# Patient Record
Sex: Male | Born: 1990 | Race: White | Hispanic: No | Marital: Single | Smoking: Former smoker
Health system: Southern US, Community
[De-identification: ages and names within clinical notes are randomized; demographics above are authoritative.]

---
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
## 2008-01-28 ENCOUNTER — Emergency Department (HOSPITAL_COMMUNITY): Admission: EM | Admit: 2008-01-28 | Discharge: 2008-01-28 | Payer: Self-pay | Admitting: Emergency Medicine

## 2008-02-06 ENCOUNTER — Ambulatory Visit (HOSPITAL_BASED_OUTPATIENT_CLINIC_OR_DEPARTMENT_OTHER): Admission: RE | Admit: 2008-02-06 | Discharge: 2008-02-06 | Payer: Self-pay | Admitting: Otolaryngology

## 2008-10-08 ENCOUNTER — Encounter: Admission: RE | Admit: 2008-10-08 | Discharge: 2008-12-05 | Payer: Self-pay | Admitting: Orthopedic Surgery

## 2008-12-06 ENCOUNTER — Encounter: Admission: RE | Admit: 2008-12-06 | Discharge: 2009-01-21 | Payer: Self-pay | Admitting: Orthopedic Surgery
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
## 2009-03-12 ENCOUNTER — Emergency Department (HOSPITAL_COMMUNITY): Admission: EM | Admit: 2009-03-12 | Discharge: 2009-03-12 | Payer: Self-pay | Admitting: Family Medicine

## 2009-06-14 ENCOUNTER — Emergency Department (HOSPITAL_COMMUNITY): Admission: EM | Admit: 2009-06-14 | Discharge: 2009-06-14 | Payer: Self-pay | Admitting: Family Medicine

## 2009-07-27 ENCOUNTER — Emergency Department (HOSPITAL_COMMUNITY): Admission: EM | Admit: 2009-07-27 | Discharge: 2009-07-27 | Payer: Self-pay | Admitting: Family Medicine
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
## 2013-03-12 ENCOUNTER — Telehealth: Payer: Self-pay | Admitting: Nurse Practitioner

## 2013-03-12 NOTE — Telephone Encounter (Signed)
Please advise 

## 2013-03-12 NOTE — Telephone Encounter (Signed)
Need chart

## 2013-03-13 ENCOUNTER — Telehealth: Payer: Self-pay | Admitting: Nurse Practitioner

## 2013-03-13 NOTE — Telephone Encounter (Signed)
Really have strong concerns about changing him to adderall due to his history. Try melatonin OTC for sleep and see if helps.

## 2013-03-14 NOTE — Telephone Encounter (Signed)
Pt's mother aware.

## 2013-03-14 NOTE — Telephone Encounter (Signed)
Please see previous telephone encounter for additional information

## 2013-05-04 ENCOUNTER — Other Ambulatory Visit: Payer: Self-pay | Admitting: Nurse Practitioner

## 2013-05-04 MED ORDER — QUETIAPINE FUMARATE 50 MG PO TABS: 50.0000 mg | ORAL_TABLET | Freq: Every day | ORAL | Status: DC
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
## 2013-06-22 ENCOUNTER — Other Ambulatory Visit: Payer: Self-pay | Admitting: Nurse Practitioner

## 2013-06-22 MED ORDER — QUETIAPINE FUMARATE 50 MG PO TABS: 50.0000 mg | ORAL_TABLET | Freq: Two times a day (BID) | ORAL | Status: DC

## 2013-08-31 ENCOUNTER — Other Ambulatory Visit: Payer: Self-pay | Admitting: Nurse Practitioner

## 2013-08-31 MED ORDER — QUETIAPINE FUMARATE 100 MG PO TABS: 100.0000 mg | ORAL_TABLET | Freq: Every day | ORAL | Status: DC

## 2014-06-14 ENCOUNTER — Other Ambulatory Visit: Payer: Self-pay | Admitting: Nurse Practitioner

## 2014-06-14 MED ORDER — ERGOCALCIFEROL 1.25 MG (50000 UT) PO CAPS: 50000.0000 [IU] | ORAL_CAPSULE | ORAL | Status: DC

## 2014-10-24 ENCOUNTER — Telehealth: Payer: Self-pay | Admitting: *Deleted

## 2014-10-24 ENCOUNTER — Encounter: Payer: Self-pay | Admitting: Nurse Practitioner

## 2014-10-24 NOTE — Telephone Encounter (Signed)
Mother called stating the she needs a letter stating that she needs a letter for Kenneth Harmon. Mother states that she has spoke with you about this.

## 2014-10-24 NOTE — Telephone Encounter (Signed)
Let mom know letter is ready for pick up.

## 2014-10-24 NOTE — Telephone Encounter (Signed)
Patient mother that letter ready to be picked up

## 2014-11-19 ENCOUNTER — Encounter: Payer: Self-pay | Admitting: Nurse Practitioner

## 2014-11-21 ENCOUNTER — Encounter: Payer: Self-pay | Admitting: Nurse Practitioner

## 2021-12-17 ENCOUNTER — Ambulatory Visit: Payer: 59 | Admitting: Nurse Practitioner

## 2021-12-17 ENCOUNTER — Encounter: Payer: Self-pay | Admitting: Nurse Practitioner

## 2021-12-17 ENCOUNTER — Other Ambulatory Visit: Payer: Self-pay

## 2021-12-17 ENCOUNTER — Ambulatory Visit (INDEPENDENT_AMBULATORY_CARE_PROVIDER_SITE_OTHER): Payer: 59

## 2021-12-17 VITALS — BP 134/85 | HR 100 | Temp 98.6°F | Resp 20 | Ht 70.0 in | Wt 208.0 lb

## 2021-12-17 DIAGNOSIS — J40 Bronchitis, not specified as acute or chronic: Secondary | ICD-10-CM | POA: Diagnosis not present

## 2021-12-17 DIAGNOSIS — R051 Acute cough: Secondary | ICD-10-CM | POA: Diagnosis not present

## 2021-12-17 MED ORDER — PREDNISONE 20 MG PO TABS: 40.0000 mg | ORAL_TABLET | Freq: Every day | ORAL | 0 refills | Status: AC

## 2021-12-17 NOTE — Progress Notes (Signed)
Subjective:    Patient ID: Kenneth Harmon, male    DOB: 04/10/1991, 31 y.o.   MRN: [id number]   Chief Complaint: cough  Patient has been sick for several days . He went to the urgent cre on 12/15/21. He had strep , flu and covid test and they were all negative. He was given decadron injection and prescription for zpak and promathazine- dextromethorphan. He is no better. Has lot so congestion in his chest that feels like it will not break up. Chest hurts when he coughs.      Review of Systems  Constitutional:  Positive for fatigue. Negative for chills and fever.  HENT:  Positive for congestion and rhinorrhea. Negative for sinus pressure and sore throat.   Respiratory:  Positive for cough. Negative for shortness of breath.   Musculoskeletal:  Negative for myalgias.  Neurological:  Positive for headaches.      Objective:   Physical Exam Vitals reviewed.  Constitutional:      Appearance: Normal appearance.  HENT:     Right Ear: Tympanic membrane normal.     Left Ear: Tympanic membrane normal.     Nose: Nose normal.     Mouth/Throat:     Mouth: Mucous membranes are moist.  Cardiovascular:     Rate and Rhythm: Normal rate and regular rhythm.  Pulmonary:     Effort: Pulmonary effort is normal.     Breath sounds: Normal breath sounds.  Musculoskeletal:     Cervical back: Normal range of motion.  Skin:    General: Skin is warm.  Neurological:     General: No focal deficit present.     Mental Status: He is alert.  Psychiatric:        Mood and Affect: Mood normal.        Behavior: Behavior normal.      BP 134/85    Pulse 100    Temp 98.6 F (37 C) (Temporal)    Resp 20    Ht 5\' 10"  (1.778 m)    Wt 208 lb (94.3 kg)    SpO2 96%    BMI 29.84 kg/m  Chest xray- clear-Preliminary reading by , FNP  [facility]     Assessment & Plan:  [facility] in today with chief complaint of Cough, Nasal Congestion, and Diarrhea   1. Acute cough - DG Chest 2 View  2.  Bronchitis 1. Take meds as prescribed 2. Use a cool mist humidifier especially during the winter months and when heat has been humid. 3. Use saline nose sprays frequently 4. Saline irrigations of the nose can be very helpful if done frequently.  * 4X daily for 1 week*  * Use of a nettie pot can be helpful with this. Follow directions with this* 5. Drink plenty of fluids 6. Keep thermostat turn down low 7.For any cough or congestion- mucinex OTC 8. For fever or aces or pains- take tylenol or ibuprofen appropriate for age and weight.  * for fevers greater than 101 orally you may alternate ibuprofen and tylenol every  3 hours.   Finish z pak as directed Meds ordered this encounter  Medications   predniSONE (DELTASONE) 20 MG tablet    Sig: Take 2 tablets (40 mg total) by mouth daily with breakfast for 5 days. 2 po daily for 5 days    Dispense:  10 tablet    Refill:  0    Order Specific Question:   Supervising Provider  Answer:   Nils Pyle [[id number]]    Urgent care records reviewed   The above assessment and management plan was discussed with the patient. The patient verbalized understanding of and has agreed to the management plan. Patient is aware to call the clinic if symptoms persist or worsen. Patient is aware when to return to the clinic for a follow-up visit. Patient educated on when it is appropriate to go to the emergency department.   Mary-Margaret Daphine Deutscher, FNP

## 2021-12-17 NOTE — Patient Instructions (Signed)

## 2023-01-17 ENCOUNTER — Ambulatory Visit: Payer: 59 | Admitting: Family Medicine

## 2023-01-17 ENCOUNTER — Encounter: Payer: Self-pay | Admitting: Family Medicine

## 2023-01-17 VITALS — BP 132/78 | HR 100 | Temp 98.2°F | Ht 70.0 in | Wt 214.0 lb

## 2023-01-17 DIAGNOSIS — J069 Acute upper respiratory infection, unspecified: Secondary | ICD-10-CM

## 2023-01-17 LAB — VERITOR FLU A/B WAIVED
Influenza A: NEGATIVE
Influenza B: NEGATIVE

## 2023-01-17 MED ORDER — BENZONATATE 100 MG PO CAPS: 100.0000 mg | ORAL_CAPSULE | Freq: Three times a day (TID) | ORAL | 0 refills | Status: DC | PRN

## 2023-01-17 MED ORDER — PROMETHAZINE-DM 6.25-15 MG/5ML PO SYRP: 2.5000 mL | ORAL_SOLUTION | Freq: Four times a day (QID) | ORAL | 0 refills | Status: DC | PRN

## 2023-01-17 NOTE — Patient Instructions (Signed)
It appears that you have a viral upper respiratory infection (cold).  Cold symptoms can last up to 2 weeks.    We are testing you for COVID.  This test should return in a couple of days.  In the meantime, a cough syrup and perle have been sent.  IF you start coughing up BROWN or BLOOD, call me immediately as this indicates you have developed a secondary BACTERIAL infection that requires an antibiotic.  - Get plenty of rest and drink plenty of fluids. - Try to breathe moist air. Use a cold mist humidifier. - Consume warm fluids (soup or tea) to provide relief for a stuffy nose and to loosen phlegm. - For nasal stuffiness, try saline nasal spray or a Neti Pot. Afrin nasal spray can also be used but this product should not be used longer than 3 days or it will cause rebound nasal stuffiness (worsening nasal congestion). - For sore throat pain relief: use chloraseptic spray, suck on throat lozenges, hard candy or popsicles; gargle with warm salt water (1/4 tsp. salt per 8 oz. of water); and eat soft, bland foods. - Eat a well-balanced diet. If you cannot, ensure you are getting enough nutrients by taking a daily multivitamin. - Avoid dairy products, as they can thicken phlegm. - Avoid alcohol, as it impairs your body's immune system.  CONTACT YOUR DOCTOR IF YOU EXPERIENCE ANY OF THE FOLLOWING: - High fever - Ear pain - Sinus-type headache - Unusually severe cold symptoms - Cough that gets worse while other cold symptoms improve - Flare up of any chronic lung problem, such as asthma - Your symptoms persist longer than 2 weeks

## 2023-01-17 NOTE — Progress Notes (Signed)
Subjective: CC:URI with cough PCP: Chevis Pretty, FNP Kenneth Harmon is a 31 y.o. male presenting to clinic today for:  1. Chest congestion Patient reports a 2 to 3-day history of cough, chest congestion.  He reports sometimes the cough is so severe that he has to bend over and almost feels like he can throw up.  He reports sinus pressure.  No measured fevers but having some chills, arthralgia and myalgia.  Today he developed nonbloody diarrhea.  No associated nausea, vomiting.  Sputum is yellow in color.  He is a former smoker.  Thinks maybe he has been having some wheezing.  Has had multiple sick contacts but not sure what they are sick with.  Currently utilizing Tylenol and some over-the-counter sinus medications with little improvement in symptoms and in fact seems to be worsening today.   ROS: see HPI  Allergies  Allergen Reactions   Amoxicillin    History reviewed. No pertinent past medical history. No current outpatient medications on file. Social History   Socioeconomic History   Marital status: Single    Spouse name: Not on file   Number of children: Not on file   Years of education: Not on file   Highest education level: Not on file  Occupational History   Not on file  Tobacco Use   Smoking status: Former    Types: Cigarettes   Smokeless tobacco: Never  Substance and Sexual Activity   Alcohol use: Not on file   Drug use: Not on file   Sexual activity: Not on file  Other Topics Concern   Not on file  Social History Narrative   Not on file   Social Determinants of Health   Financial Resource Strain: Not on file  Food Insecurity: Not on file  Transportation Needs: Not on file  Physical Activity: Not on file  Stress: Not on file  Social Connections: Not on file  Intimate Partner Violence: Not on file   History reviewed. No pertinent family history.  Objective: Office vital signs reviewed. BP 132/78   Pulse 100   Temp 98.2 F (36.8 C)    Ht 5' 10"$  (1.778 m)   Wt 214 lb (97.1 kg)   SpO2 99%   BMI 30.71 kg/m   Physical Examination:  General: Awake, alert, nontoxic male, No acute distress HEENT: Normal    Neck: No masses palpated. No lymphadenopathy    Ears: Tympanic membranes intact, normal light reflex, no erythema, no bulging    Eyes: PERRLA, extraocular membranes intact, sclera white    Nose: nasal turbinates moist, clear nasal discharge    Throat: moist mucus membranes, minimal oropharyngeal erythema, no tonsillar exudate.  Airway is patent Cardio: Mildly tachycardic with regular rhythm.  S1S2 heard, no murmurs appreciated Pulm: clear to auscultation bilaterally, no wheezes, rhonchi or rales; normal work of breathing on room air  Assessment/ Plan: 32 y.o. male   URI with cough and congestion - Plan: COVID-19, Flu A+B and RSV, Veritor Flu A/B Waived, promethazine-dextromethorphan (PROMETHAZINE-DM) 6.25-15 MG/5ML syrup, benzonatate (TESSALON PERLES) 100 MG capsule  Appears to be viral URI.  Nothing to suggest secondary bacterial infection at this time but we discussed signs and symptoms that would suggest bacterial infection and warrant antibiotic usage.  Rapid flu was negative.  Cough medications have been sent to pharmacy.  Work note provided.  Follow-up as needed  Orders Placed This Encounter  Procedures   COVID-19, Flu A+B and RSV    Order Specific Question:  Previously tested for COVID-19    Answer:   No    Order Specific Question:   Resident in a congregate (group) care setting    Answer:   No    Order Specific Question:   Is the patient student?    Answer:   No    Order Specific Question:   Employed in healthcare setting    Answer:   No    Order Specific Question:   Has patient completed COVID vaccination(s) (2 doses of Pfizer/Moderna 1 dose of Sharon)    Answer:   No   Veritor Flu A/B Waived    Order Specific Question:   Source    Answer:   swab   No orders of the defined types were placed  in this encounter.    Janora Norlander, DO [place] [phone]

## 2023-01-19 ENCOUNTER — Other Ambulatory Visit: Payer: Self-pay | Admitting: Family Medicine

## 2023-01-19 LAB — COVID-19, FLU A+B AND RSV
Influenza A, NAA: NOT DETECTED
Influenza B, NAA: DETECTED — AB
RSV, NAA: NOT DETECTED
SARS-CoV-2, NAA: NOT DETECTED

## 2023-01-19 MED ORDER — AZITHROMYCIN 250 MG PO TABS: ORAL_TABLET | ORAL | 0 refills | Status: DC

## 2024-01-05 ENCOUNTER — Telehealth: Payer: 59 | Admitting: Family

## 2024-01-05 ENCOUNTER — Encounter: Payer: Self-pay | Admitting: Family

## 2024-01-05 DIAGNOSIS — R112 Nausea with vomiting, unspecified: Secondary | ICD-10-CM

## 2024-01-05 DIAGNOSIS — R6889 Other general symptoms and signs: Secondary | ICD-10-CM

## 2024-01-05 DIAGNOSIS — R059 Cough, unspecified: Secondary | ICD-10-CM

## 2024-01-05 DIAGNOSIS — R519 Headache, unspecified: Secondary | ICD-10-CM

## 2024-01-05 MED ORDER — ONDANSETRON HCL 4 MG PO TABS: 4.0000 mg | ORAL_TABLET | Freq: Three times a day (TID) | ORAL | 0 refills | Status: DC | PRN

## 2024-01-05 MED ORDER — OSELTAMIVIR PHOSPHATE 75 MG PO CAPS: 75.0000 mg | ORAL_CAPSULE | Freq: Two times a day (BID) | ORAL | 0 refills | Status: DC

## 2024-01-05 NOTE — Progress Notes (Signed)
Virtual Visit Consent   Kenneth Harmon, you are scheduled for a virtual visit with a [facility] provider today. Just as with appointments in the office, your consent must be obtained to participate. Your consent will be active for this visit and any virtual visit you may have with one of our providers in the next 365 days. If you have a MyChart account, a copy of this consent can be sent to you electronically.  As this is a virtual visit, video technology does not allow for your provider to perform a traditional examination. This may limit your provider's ability to fully assess your condition. If your provider identifies any concerns that need to be evaluated in person or the need to arrange testing (such as labs, EKG, etc.), we will make arrangements to do so. Although advances in technology are sophisticated, we cannot ensure that it will always work on either your end or our end. If the connection with a video visit is poor, the visit may have to be switched to a telephone visit. With either a video or telephone visit, we are not always able to ensure that we have a secure connection.  By engaging in this virtual visit, you consent to the provision of healthcare and authorize for your insurance to be billed (if applicable) for the services provided during this visit. Depending on your insurance coverage, you may receive a charge related to this service.  I need to obtain your verbal consent now. Are you willing to proceed with your visit today? Kenneth Harmon has provided verbal consent on 01/05/2024 for a virtual visit (video or telephone). Kenneth Rodney, FNP  Date: 01/05/2024 10:38 AM  Virtual Visit via Video Note   I, Kenneth Harmon, connected with  Kenneth Harmon  ([id number], 02-Sep-1991) on 01/05/24 at 10:25 AM EST by a video-enabled telemedicine application and verified that I am speaking with the correct person using two identifiers.  Location: Patient: Virtual Visit Location Patient:  Home Provider: Virtual Visit Location Provider: Home Office   I discussed the limitations of evaluation and management by telemedicine and the availability of in person appointments. The patient expressed understanding and agreed to proceed.    History of Present Illness: Kenneth Harmon is a 33 y.o. who identifies as a male who was assigned male at birth, and is being seen today for cough, congestion, nausea and vomiting.  HPI: Emesis  This is a new problem. The current episode started yesterday. The problem occurs less than 2 times per day. The problem has been gradually improving. The emesis has an appearance of stomach contents. There has been no fever. Associated symptoms include coughing, diarrhea, headaches and myalgias. Pertinent negatives include no chest pain, chills, fever, URI or weight loss. He has tried bed rest and increased fluids for the symptoms. The treatment provided mild relief.  Influenza This is a new problem. The current episode started yesterday. Associated symptoms include congestion, coughing, headaches, myalgias, nausea, vomiting and weakness. Pertinent negatives include no chest pain, chills or fever.    Problems: There are no active problems to display for this patient.   Allergies:  Allergies  Allergen Reactions   Amoxicillin    Medications:  Current Outpatient Medications:    ondansetron (ZOFRAN) 4 MG tablet, Take 1 tablet (4 mg total) by mouth every 8 (eight) hours as needed for nausea or vomiting., Disp: 20 tablet, Rfl: 0   oseltamivir (TAMIFLU) 75 MG capsule, Take 1 capsule (75 mg total) by mouth 2 (  two) times daily., Disp: 10 capsule, Rfl: 0  Observations/Objective: Patient is well-developed, well-nourished in no acute distress.  Resting comfortably  at home.  Head is normocephalic, atraumatic.  No labored breathing.  Speech is clear and coherent with logical content.  Patient is alert and oriented at baseline.  Dry nonproductive cough  Assessment  and Plan: 1. Flu-like symptoms (Primary) - oseltamivir (TAMIFLU) 75 MG capsule; Take 1 capsule (75 mg total) by mouth 2 (two) times daily.  Dispense: 10 capsule; Refill: 0 - ondansetron (ZOFRAN) 4 MG tablet; Take 1 tablet (4 mg total) by mouth every 8 (eight) hours as needed for nausea or vomiting.  Dispense: 20 tablet; Refill: 0  Rest Force fluids Tylenol as needed  Zofran and Tamiflu Work note given  Follow up if symptoms worsen or do not improve   Follow Up Instructions: I discussed the assessment and treatment plan with the patient. The patient was provided an opportunity to ask questions and all were answered. The patient agreed with the plan and demonstrated an understanding of the instructions.  A copy of instructions were sent to the patient via MyChart unless otherwise noted below.     The patient was advised to call back or seek an in-person evaluation if the symptoms worsen or if the condition fails to improve as anticipated.    Kenneth Rodney, FNP

## 2024-01-05 NOTE — Patient Instructions (Signed)

## 2024-06-21 ENCOUNTER — Encounter: Payer: Self-pay | Admitting: Nurse Practitioner

## 2024-06-21 ENCOUNTER — Ambulatory Visit: Admitting: Nurse Practitioner

## 2024-06-21 VITALS — BP 136/86 | HR 97 | Temp 98.0°F | Ht 70.0 in | Wt 206.0 lb

## 2024-06-21 DIAGNOSIS — K591 Functional diarrhea: Secondary | ICD-10-CM | POA: Diagnosis not present

## 2024-06-21 DIAGNOSIS — J069 Acute upper respiratory infection, unspecified: Secondary | ICD-10-CM | POA: Diagnosis not present

## 2024-06-21 MED ORDER — FLUTICASONE PROPIONATE 50 MCG/ACT NA SUSP: 2.0000 | Freq: Every day | NASAL | 6 refills | Status: AC

## 2024-06-21 MED ORDER — LORATADINE 10 MG PO TABS: 10.0000 mg | ORAL_TABLET | Freq: Every day | ORAL | 11 refills | Status: AC

## 2024-06-21 NOTE — Progress Notes (Signed)
 Subjective:    Patient ID: Kenneth Harmon, male    DOB: Jul 07, 1991, 33 y.o.   MRN: [id number]   Chief Complaint: URI   URI  This is a new problem. The current episode started yesterday. The problem has been waxing and waning. The maximum temperature recorded prior to his arrival was 100.4 - 100.9 F. Associated symptoms include congestion, coughing, headaches and rhinorrhea. Pertinent negatives include no sinus pain or sore throat. He has tried acetaminophen for the symptoms. The treatment provided mild relief.   Also has diarrhea- has gone 4-5 x since this morning.  There are no active problems to display for this patient.      Review of Systems  HENT:  Positive for congestion and rhinorrhea. Negative for sinus pain and sore throat.   Respiratory:  Positive for cough.   Neurological:  Positive for headaches.       Objective:   Physical Exam Vitals and nursing note reviewed.  Constitutional:      Appearance: Normal appearance. He is well-developed.  HENT:     Head: Normocephalic.     Right Ear: Tympanic membrane normal.     Left Ear: Tympanic membrane normal.     Nose: Congestion and rhinorrhea present.     Mouth/Throat:     Mouth: Mucous membranes are moist.     Pharynx: Oropharynx is clear.  Eyes:     Pupils: Pupils are equal, round, and reactive to light.  Neck:     Thyroid: No thyroid mass or thyromegaly.     Vascular: No carotid bruit or JVD.     Trachea: Phonation normal.  Cardiovascular:     Rate and Rhythm: Normal rate and regular rhythm.  Pulmonary:     Effort: Pulmonary effort is normal. No respiratory distress.     Breath sounds: Normal breath sounds.  Abdominal:     General: Bowel sounds are normal.     Palpations: Abdomen is soft.     Tenderness: There is no abdominal tenderness.  Musculoskeletal:        General: Normal range of motion.     Cervical back: Normal range of motion and neck supple.  Lymphadenopathy:     Cervical: No cervical  adenopathy.  Skin:    General: Skin is warm and dry.  Neurological:     Mental Status: He is alert and oriented to person, place, and time.  Psychiatric:        Behavior: Behavior normal.        Thought Content: Thought content normal.        Judgment: Judgment normal.    BP 136/86   Pulse 97   Temp 98 F (36.7 C) (Temporal)   Ht 5' 10 (1.778 m)   Wt 206 lb (93.4 kg)   SpO2 97%   BMI 29.56 kg/m         Assessment & Plan:  Norleen KATHEE Saupe in today with chief complaint of upset stomach (Dizzy, Fatigue, Sore all over. Started at 2 am this morning)   1. Upper respiratory infection with cough and congestion (Primary) 1. Take meds as prescribed 2. Use a cool mist humidifier especially during the winter months and when heat has been humid. 3. Use saline nose sprays frequently 4. Saline irrigations of the nose can be very helpful if done frequently.  * 4X daily for 1 week*  * Use of a nettie pot can be helpful with this. Follow directions with this* 5. Drink plenty of  fluids 6. Keep thermostat turn down low 7.For any cough or congestion- mucinex if needed 8. For fever or aces or pains- take tylenol or ibuprofen appropriate for age and weight.  * for fevers greater than 101 orally you may alternate ibuprofen and tylenol every  3 hours.    - fluticasone  (FLONASE ) 50 MCG/ACT nasal spray; Place 2 sprays into both nostrils daily.  Dispense: 16 g; Refill: 6 - loratadine  (CLARITIN ) 10 MG tablet; Take 1 tablet (10 mg total) by mouth daily.  Dispense: 30 tablet; Refill: 11  2. Functional diarrhea Force fluids Imodium A otc as needed    The above assessment and management plan was discussed with the patient. The patient verbalized understanding of and has agreed to the management plan. Patient is aware to call the clinic if symptoms persist or worsen. Patient is aware when to return to the clinic for a follow-up visit. Patient educated on when it is appropriate to go to the  emergency department.   Mary-Margaret Gladis, FNP

## 2024-06-21 NOTE — Patient Instructions (Signed)
 Diarrhea, Adult Diarrhea is when you pass loose and sometimes watery poop (stool) often. Diarrhea can make you feel weak and cause you to lose water in your body (get dehydrated). Losing water in your body can cause you to: Feel tired and thirsty. Have a dry mouth. Go pee (urinate) less often. Diarrhea often lasts 2-3 days. It can last longer if it is a sign of something more serious. Be sure to treat your diarrhea as told by your doctor. Follow these instructions at home: Eating and drinking     Follow these instructions as told by your doctor: Take an ORS (oral rehydration solution). This is a drink that helps you replace fluids and minerals your body lost. It is sold at pharmacies and stores. Drink enough fluid to keep your pee (urine) pale yellow. Drink fluids such as: Water. You can also get fluids by sucking on ice chips. Diluted fruit juice. Low-calorie sports drinks. Milk. Avoid drinking fluids that have a lot of sugar or caffeine in them. These include soda, energy drinks, and regular sports drinks. Avoid alcohol. Eat bland, easy-to-digest foods in small amounts as you are able. These foods include: Bananas. Applesauce. Rice. Low-fat (lean) meats. Toast. Crackers. Avoid spicy or fatty foods.  Medicines Take over-the-counter and prescription medicines only as told by your doctor. If you were prescribed antibiotics, take them as told by your doctor. Do not stop taking them even if you start to feel better. General instructions  Wash your hands often using soap and water for 20 seconds. If soap and water are not available, use hand sanitizer. Others in your home should wash their hands as well. Wash your hands: After using the toilet or changing a diaper. Before preparing, cooking, or serving food. While caring for a sick person. While visiting someone in a hospital. Rest at home while you get better. Take a warm bath to help with any burning or pain from having  diarrhea. Watch your condition for any changes. Contact a doctor if: You have a fever. Your diarrhea gets worse. You have new symptoms. You vomit every time you eat or drink. You feel light-headed, dizzy, or you have a headache. You have muscle cramps. You have signs of losing too much water in your body, such as: Dark pee, very little pee, or no pee. Cracked lips. Dry mouth. Sunken eyes. Sleepiness. Weakness. You have bloody or black poop or poop that looks like tar. You have very bad pain, cramping, or bloating in your belly (abdomen). Your skin feels cold and clammy. You feel confused. Get help right away if: You have chest pain. Your heart is beating very quickly. You have trouble breathing or you are breathing very quickly. You feel very weak or you faint. These symptoms may be an emergency. Get help right away. Call 911. Do not wait to see if the symptoms will go away. Do not drive yourself to the hospital. This information is not intended to replace advice given to you by your health care provider. Make sure you discuss any questions you have with your health care provider. Document Revised: 05/11/2022 Document Reviewed: 05/11/2022 Elsevier Patient Education  2024 [employer].

## 2024-07-09 ENCOUNTER — Telehealth (INDEPENDENT_AMBULATORY_CARE_PROVIDER_SITE_OTHER): Admitting: Nurse Practitioner

## 2024-07-09 ENCOUNTER — Encounter: Payer: Self-pay | Admitting: Nurse Practitioner

## 2024-07-09 ENCOUNTER — Ambulatory Visit: Payer: Self-pay

## 2024-07-09 DIAGNOSIS — K219 Gastro-esophageal reflux disease without esophagitis: Secondary | ICD-10-CM | POA: Diagnosis not present

## 2024-07-09 DIAGNOSIS — F419 Anxiety disorder, unspecified: Secondary | ICD-10-CM

## 2024-07-09 MED ORDER — PANTOPRAZOLE SODIUM 40 MG PO TBEC: 40.0000 mg | DELAYED_RELEASE_TABLET | Freq: Every day | ORAL | 3 refills | Status: DC

## 2024-07-09 MED ORDER — ESCITALOPRAM OXALATE 10 MG PO TABS: 10.0000 mg | ORAL_TABLET | Freq: Every day | ORAL | 1 refills | Status: AC

## 2024-07-09 NOTE — Progress Notes (Signed)
 Virtual Visit Consent   Kenneth Harmon, you are scheduled for a virtual visit with Mary-Margaret Gladis, FNP, a [facility] provider, today.     Just as with appointments in the office, your consent must be obtained to participate.  Your consent will be active for this visit and any virtual visit you may have with one of our providers in the next 365 days.     If you have a MyChart account, a copy of this consent can be sent to you electronically.  All virtual visits are billed to your insurance company just like a traditional visit in the office.    As this is a virtual visit, video technology does not allow for your provider to perform a traditional examination.  This may limit your provider's ability to fully assess your condition.  If your provider identifies any concerns that need to be evaluated in person or the need to arrange testing (such as labs, EKG, etc.), we will make arrangements to do so.     Although advances in technology are sophisticated, we cannot ensure that it will always work on either your end or our end.  If the connection with a video visit is poor, the visit may have to be switched to a telephone visit.  With either a video or telephone visit, we are not always able to ensure that we have a secure connection.     I need to obtain your verbal consent now.   Are you willing to proceed with your visit today? YES   Kenneth Harmon has provided verbal consent on 07/09/2024 for a virtual visit (video or telephone).   Mary-Margaret Gladis, FNP   Date: 07/09/2024 2:18 PM   Virtual Visit via Video Note   I, Mary-Margaret Gladis, connected with Kenneth Harmon ([id number], 1991/05/26) on 07/09/24 at  2:15 PM EDT by a video-enabled telemedicine application and verified that I am speaking with the correct person using two identifiers.  Location: Patient: Virtual Visit Location Patient: Home Provider: Virtual Visit Location Provider: Mobile   I discussed the limitations of  evaluation and management by telemedicine and the availability of in person appointments. The patient expressed understanding and agreed to proceed.    History of Present Illness: Kenneth Harmon is a 33 y.o. who identifies as a male who was assigned male at birth, and is being seen today for heartburn.  HPI:  Patient has been having lots of heart burn. He has been under a lot of stress ad he is staying anxious. Waking up with anxiety. Having panic attacks where chest feels tight. Gets upset very easily.    07/09/2024    2:24 PM 06/21/2024    2:05 PM 12/17/2021    2:03 PM  GAD 7 : Generalized Anxiety Score  Nervous, Anxious, on Edge 3 0 1  Control/stop worrying 2 0 0  Worry too much - different things 3 0 1  Trouble relaxing 3 0 0  Restless 0 0 0  Easily annoyed or irritable 3 0 1  Afraid - awful might happen 3 0 0  Total GAD 7 Score 17 0 3  Anxiety Difficulty Very difficult Not difficult at all Somewhat difficult      Review of Systems  Constitutional:  Negative for diaphoresis and weight loss.  Eyes:  Negative for blurred vision, double vision and pain.  Respiratory:  Negative for shortness of breath.   Cardiovascular:  Negative for chest pain, palpitations, orthopnea and leg swelling.  Gastrointestinal:  Negative for abdominal pain.  Skin:  Negative for rash.  Neurological:  Negative for dizziness, sensory change, loss of consciousness, weakness and headaches.  Endo/Heme/Allergies:  Negative for polydipsia. Does not bruise/bleed easily.  Psychiatric/Behavioral:  Negative for memory loss. The patient is nervous/anxious. The patient does not have insomnia.   All other systems reviewed and are negative.   Problems: There are no active problems to display for this patient.   Allergies:  Allergies  Allergen Reactions   Amoxicillin    Medications:  Current Outpatient Medications:    fluticasone  (FLONASE ) 50 MCG/ACT nasal spray, Place 2 sprays into both nostrils daily., Disp:  16 g, Rfl: 6   loratadine  (CLARITIN ) 10 MG tablet, Take 1 tablet (10 mg total) by mouth daily., Disp: 30 tablet, Rfl: 11  Observations/Objective: Patient is well-developed, well-nourished in no acute distress.  Resting comfortably  at home.  Head is normocephalic, atraumatic.  No labored breathing.  Speech is clear and coherent with logical content.  Patient is alert and oriented at baseline.  Talkative Good eye contact  Assessment and Plan:  Kenneth Harmon in today with chief complaint of No chief complaint on file.   1. Gastroesophageal reflux disease without esophagitis (Primary) Avoid spicy foods Do not eat 2 hours prior to bedtime - pantoprazole  (PROTONIX ) 40 MG tablet; Take 1 tablet (40 mg total) by mouth daily.  Dispense: 30 tablet; Refill: 3  2. Anxiety Stress management - escitalopram  (LEXAPRO ) 10 MG tablet; Take 1 tablet (10 mg total) by mouth daily.  Dispense: 90 tablet; Refill: 1    Follow Up Instructions: I discussed the assessment and treatment plan with the patient. The patient was provided an opportunity to ask questions and all were answered. The patient agreed with the plan and demonstrated an understanding of the instructions.  A copy of instructions were sent to the patient via MyChart.  The patient was advised to call back or seek an in-person evaluation if the symptoms worsen or if the condition fails to improve as anticipated.  Time:  I spent 7 minutes with the patient via telehealth technology discussing the above problems/concerns.    Mary-Margaret Gladis, FNP

## 2024-07-09 NOTE — Telephone Encounter (Signed)
 Called and spoke with patient. He states that he is otw to a job but would like to do a video visit today if possible. Virtual appt made for this afternoon

## 2024-07-09 NOTE — Telephone Encounter (Signed)
 FYI Only or Action Required?: Action required by provider: request for appointment.  Patient was last seen in primary care on 06/21/2024 by Kenneth Mustard, FNP.  Called Nurse Triage reporting Heartburn.  Symptoms began several days ago.  Interventions attempted: OTC medications: Mylanta, Tums and Prescription medications: famotidine, omeprazole.  Symptoms are: indigestion/heartburn, belching, nausea, moderate soreness in right collarbone at present, panic attacks (felt indigestion on Friday that turned into left arm tingling and neck pain and was seen in urgent care), constipation, anxiety stable.  Triage Disposition: See Physician Within 24 Hours  Patient/caregiver understands and will follow disposition?: Unsure             Copied from CRM #[id number]. Topic: Clinical - Red Word Triage >> Jul 09, 2024 10:53 AM Kenneth Harmon wrote: Red Word that prompted transfer to Nurse Triage: The patient's mother states the patient has been having severe heart burn that is causing him chest pain. He went to an Urgent Care and had an EKG done that was normal but he is still symptomatic. Reason for Disposition  [1] MODERATE pain (e.g., interferes with normal activities) AND [2] comes and goes (cramps) AND [3] present > 24 hours  (Exception: Pain with Vomiting or Diarrhea - see that Guideline.)  Answer Assessment - Initial Assessment Questions Friday patient states he went to urgent care. He states Saturday evening his heartburn returned when he started having stress. He states it is accompanied with anxiety. He states he received famotidine and omeprazole as well as nausea medication from urgent care. Patient states he thinks these are panic attacks that are coming on.  1. LOCATION: Where does it hurt?      Right collarbone today. He states on Friday it was belching and indigestion that turned into dizziness, tingling in his left arm and in his neck. He states he felt panicked and was stressed  and started to have a hard time breathing.  2. RADIATION: Does the pain shoot anywhere else? (e.g., chest, back)     Today, no.  3. ONSET: When did the pain begin? (e.g., minutes, hours or days ago)      Wednesday.  4. SUDDEN: Gradual or sudden onset?     Sudden.  5. PATTERN Does the pain come and go, or is it constant?     Comes and goes.  6. SEVERITY: How bad is the pain?  (e.g., Scale 1-10; mild, moderate, or severe)     4-5/10.  7. RECURRENT SYMPTOM: Have you ever had this type of stomach pain before? If Yes, ask: When was the last time? and What happened that time?      No, he states his anxiety is bad but he has not had anything like this happen.  8. AGGRAVATING FACTORS: Does anything seem to cause this pain? (e.g., foods, stress, alcohol)     Stress.  9. CARDIAC SYMPTOMS: Do you have any of the following symptoms: chest pain, difficulty breathing, sweating, nausea?     No chest pain or difficulty breathing at this time.  10. OTHER SYMPTOMS: Do you have any other symptoms? (e.g., back pain, diarrhea, fever, urination pain, vomiting)       Sore throat, belching, nausea, states he had a feeling pulse in his arms and legs, felt flexed out, constipation, anxiety. Patient states the heartburn and chest pain has gone down.  11. PREGNANCY: Is there any chance you are pregnant? When was your last menstrual period?       N/A.  Protocols used: Abdominal  Pain - Upper-A-AH

## 2024-07-09 NOTE — Patient Instructions (Signed)

## 2024-07-14 ENCOUNTER — Other Ambulatory Visit: Payer: Self-pay | Admitting: Nurse Practitioner

## 2024-07-14 DIAGNOSIS — K219 Gastro-esophageal reflux disease without esophagitis: Secondary | ICD-10-CM

## 2024-07-16 NOTE — Telephone Encounter (Signed)
 Insurance will not pay for protonix - sent in prescripton for aciphex

## 2024-07-16 NOTE — Telephone Encounter (Signed)
  RABEprazole (ACIPHEX) 20 MG tablet        Changed from: pantoprazole  (PROTONIX ) 40 MG tablet   Pharmacy comment: Alternative Requested:INSURANCE WILL ONLY COVER 90 TABS PER 365 DAYS- NEEDS PRIOR AUTHORIZATION.   All Pharmacy Suggested Alternatives:  RABEprazole (ACIPHEX) 20 MG tablet lansoprazole (PREVACID) 30 MG capsule esomeprazole (NEXIUM) 20 MG capsule omeprazole (PRILOSEC) 20 MG capsule

## 2024-08-07 ENCOUNTER — Ambulatory Visit: Admitting: Nurse Practitioner

## 2024-08-09 ENCOUNTER — Encounter: Payer: Self-pay | Admitting: Nurse Practitioner
